# Patient Record
Sex: Male | Born: 1995 | Race: White | Hispanic: Yes | Marital: Single | Smoking: Never smoker
Health system: Southern US, Community
[De-identification: ages and names within clinical notes are randomized; demographics above are authoritative.]

---
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
## 2016-06-25 ENCOUNTER — Emergency Department (HOSPITAL_COMMUNITY)
Admission: EM | Admit: 2016-06-25 | Discharge: 2016-06-25 | Disposition: A | Discharge status: Home / Self Care | Payer: No Typology Code available for payment source | Attending: Emergency Medicine | Admitting: Emergency Medicine

## 2016-06-25 ENCOUNTER — Encounter (HOSPITAL_COMMUNITY): Payer: Self-pay | Admitting: *Deleted

## 2016-06-25 DIAGNOSIS — M542 Cervicalgia: Secondary | ICD-10-CM | POA: Diagnosis not present

## 2016-06-25 DIAGNOSIS — Y92410 Unspecified street and highway as the place of occurrence of the external cause: Secondary | ICD-10-CM | POA: Insufficient documentation

## 2016-06-25 DIAGNOSIS — M545 Low back pain, unspecified: Secondary | ICD-10-CM

## 2016-06-25 DIAGNOSIS — Y939 Activity, unspecified: Secondary | ICD-10-CM | POA: Diagnosis not present

## 2016-06-25 DIAGNOSIS — Z79899 Other long term (current) drug therapy: Secondary | ICD-10-CM | POA: Insufficient documentation

## 2016-06-25 DIAGNOSIS — Y999 Unspecified external cause status: Secondary | ICD-10-CM | POA: Diagnosis not present

## 2016-06-25 MED ORDER — NAPROXEN 500 MG PO TABS: 500.0000 mg | ORAL_TABLET | Freq: Two times a day (BID) | ORAL | 0 refills | Status: DC

## 2016-06-25 MED ORDER — METHOCARBAMOL 500 MG PO TABS: 500.0000 mg | ORAL_TABLET | Freq: Every evening | ORAL | 0 refills | Status: DC | PRN

## 2016-06-25 NOTE — ED Notes (Signed)
Discharge instructions, follow up care, and rx x2 reviewed with patient. Patient verbalized understanding. 
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
## 2019-05-01 ENCOUNTER — Ambulatory Visit: Payer: Self-pay | Attending: Internal Medicine

## 2019-05-01 DIAGNOSIS — Z20822 Contact with and (suspected) exposure to covid-19: Secondary | ICD-10-CM | POA: Insufficient documentation

## 2019-05-02 LAB — NOVEL CORONAVIRUS, NAA: SARS-CoV-2, NAA: NOT DETECTED

## 2022-01-31 ENCOUNTER — Emergency Department (HOSPITAL_COMMUNITY): Payer: Self-pay

## 2022-01-31 ENCOUNTER — Other Ambulatory Visit: Payer: Self-pay

## 2022-01-31 ENCOUNTER — Encounter (HOSPITAL_COMMUNITY): Payer: Self-pay | Admitting: Emergency Medicine

## 2022-01-31 ENCOUNTER — Emergency Department (HOSPITAL_COMMUNITY)
Admission: EM | Admit: 2022-01-31 | Discharge: 2022-01-31 | Discharge status: Against Medical Advice | Payer: Self-pay | Attending: Student | Admitting: Student

## 2022-01-31 DIAGNOSIS — Z5321 Procedure and treatment not carried out due to patient leaving prior to being seen by health care provider: Secondary | ICD-10-CM | POA: Insufficient documentation

## 2022-01-31 DIAGNOSIS — W0110XA Fall on same level from slipping, tripping and stumbling with subsequent striking against unspecified object, initial encounter: Secondary | ICD-10-CM | POA: Insufficient documentation

## 2022-01-31 DIAGNOSIS — Y9302 Activity, running: Secondary | ICD-10-CM | POA: Insufficient documentation

## 2022-01-31 DIAGNOSIS — Y92480 Sidewalk as the place of occurrence of the external cause: Secondary | ICD-10-CM | POA: Insufficient documentation

## 2022-01-31 DIAGNOSIS — S0181XA Laceration without foreign body of other part of head, initial encounter: Secondary | ICD-10-CM | POA: Insufficient documentation

## 2022-01-31 DIAGNOSIS — S4991XA Unspecified injury of right shoulder and upper arm, initial encounter: Secondary | ICD-10-CM | POA: Insufficient documentation

## 2022-01-31 DIAGNOSIS — S01511A Laceration without foreign body of lip, initial encounter: Secondary | ICD-10-CM | POA: Insufficient documentation

## 2022-01-31 MED ORDER — TETANUS-DIPHTH-ACELL PERTUSSIS 5-2.5-18.5 LF-MCG/0.5 IM SUSY: 0.5000 mL | PREFILLED_SYRINGE | Freq: Once | INTRAMUSCULAR | Status: DC

## 2022-01-31 NOTE — ED Notes (Signed)
Pt state they was leaving
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
## 2022-02-01 ENCOUNTER — Other Ambulatory Visit: Payer: Self-pay

## 2022-02-01 ENCOUNTER — Encounter (HOSPITAL_COMMUNITY): Payer: Self-pay

## 2022-02-01 ENCOUNTER — Emergency Department (HOSPITAL_COMMUNITY)
Admission: EM | Admit: 2022-02-01 | Discharge: 2022-02-01 | Disposition: A | Discharge status: Home / Self Care | Payer: Self-pay | Attending: Emergency Medicine | Admitting: Emergency Medicine

## 2022-02-01 DIAGNOSIS — S01511A Laceration without foreign body of lip, initial encounter: Secondary | ICD-10-CM | POA: Insufficient documentation

## 2022-02-01 DIAGNOSIS — W228XXA Striking against or struck by other objects, initial encounter: Secondary | ICD-10-CM | POA: Insufficient documentation

## 2022-02-01 DIAGNOSIS — Z23 Encounter for immunization: Secondary | ICD-10-CM | POA: Insufficient documentation

## 2022-02-01 MED ORDER — TETANUS-DIPHTH-ACELL PERTUSSIS 5-2.5-18.5 LF-MCG/0.5 IM SUSY: 0.5000 mL | PREFILLED_SYRINGE | Freq: Once | INTRAMUSCULAR | Status: DC

## 2022-02-01 MED ORDER — LIDOCAINE HCL 2 % IJ SOLN
10.0000 mL | Freq: Once | INTRAMUSCULAR | Status: AC
  Administered 2022-02-01: 200 mg
  Filled 2022-02-01: qty 20

## 2022-02-01 MED ORDER — AMOXICILLIN-POT CLAVULANATE 875-125 MG PO TABS: 1.0000 | ORAL_TABLET | Freq: Two times a day (BID) | ORAL | 0 refills | Status: DC

## 2022-02-01 MED ORDER — TETANUS-DIPHTH-ACELL PERTUSSIS 5-2.5-18.5 LF-MCG/0.5 IM SUSY
0.5000 mL | PREFILLED_SYRINGE | Freq: Once | INTRAMUSCULAR | Status: AC
  Administered 2022-02-01: 0.5 mL via INTRAMUSCULAR
  Filled 2022-02-01: qty 0.5

## 2022-02-01 MED ORDER — AMOXICILLIN-POT CLAVULANATE 875-125 MG PO TABS
1.0000 | ORAL_TABLET | Freq: Once | ORAL | Status: AC
  Administered 2022-02-01: 1 via ORAL
  Filled 2022-02-01: qty 1

## 2022-02-01 NOTE — Discharge Instructions (Addendum)
Take Augmentin twice daily for a week to prevent infection  As we discussed, I was able to suture your laceration and I recommend soft diet for several days.  I also recommend that you have a wound check visit with urgent care within the next week or so.  We have updated your tetanus shot  See your primary care doctor  Return to ER if you have worse lip swelling, purulent discharge from the lip, severe pain, fever
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
## 2022-02-01 NOTE — ED Provider Notes (Signed)
[facility] EMERGENCY DEPARTMENT Provider Note   CSN: [id number] Arrival date & time: 02/01/22  0865     History  No chief complaint on file.   Michael Oconnor is a 26 y.o. male here presenting with left lower lip laceration.  Patient states that he was drinking Saturday night and tumbled and hit the lower part of his lip and had a laceration.  He states that he waited many hours and decided to leave.  He states that his lip laceration still does not close and he is here for laceration repair.  Patient denies any medical problems.  Denies any other injuries.  The history is provided by the patient.       Home Medications Prior to Admission medications   Medication Sig Start Date End Date Taking? Authorizing Provider  acetaminophen (TYLENOL) 500 MG tablet Take 1,000 mg by mouth once.    [provider]  methocarbamol (ROBAXIN) 500 MG tablet Take 1 tablet (500 mg total) by mouth at bedtime as needed for muscle spasms. 06/25/16   Mathews Robinsons B, PA-C  naproxen (NAPROSYN) 500 MG tablet Take 1 tablet (500 mg total) by mouth 2 (two) times daily with a meal. 06/25/16   Georgiana Shore, PA-C      Allergies    Patient has no known allergies.    Review of Systems   Review of Systems  Skin:  Positive for wound.  All other systems reviewed and are negative.   Physical Exam Updated Vital Signs BP (!) 140/78 (BP Location: Right Arm)   Pulse 81   Temp 98.3 F (36.8 C) (Oral)   Resp 15   SpO2 100%  Physical Exam Vitals and nursing note reviewed.  HENT:     Head: Normocephalic.     Comments: U-shaped laceration in the forehead that is healing     Nose: Nose normal.     Mouth/Throat:     Comments: 4 cm laceration in the left lower lip.  It does cross the vermilion border  Eyes:     Extraocular Movements: Extraocular movements intact.     Pupils: Pupils are equal, round, and reactive to light.  Cardiovascular:     Rate and Rhythm: Normal  rate and regular rhythm.     Pulses: Normal pulses.  Pulmonary:     Effort: Pulmonary effort is normal.     Breath sounds: Normal breath sounds.  Abdominal:     General: Abdomen is flat.     Palpations: Abdomen is soft.  Musculoskeletal:        General: Normal range of motion.     Cervical back: Normal range of motion and neck supple.  Skin:    General: Skin is warm.     Capillary Refill: Capillary refill takes less than 2 seconds.  Neurological:     General: No focal deficit present.     Mental Status: He is alert and oriented to person, place, and time.  Psychiatric:        Mood and Affect: Mood normal.        Behavior: Behavior normal.     ED Results / Procedures / Treatments   Labs (all labs ordered are listed, but only abnormal results are displayed) Labs Reviewed - No data to display  EKG None  Radiology DG Shoulder Right  Result Date: 01/31/2022 CLINICAL DATA:  Fall EXAM: RIGHT SHOULDER - 2+ VIEW COMPARISON:  None Available. FINDINGS: There is no evidence of fracture or dislocation. There  is no evidence of arthropathy or other focal bone abnormality. Soft tissues are unremarkable. IMPRESSION: Negative. Electronically Signed   By: Charlett Nose M.D.   On: 01/31/2022 02:51   CT Head Wo Contrast  Result Date: 01/31/2022 CLINICAL DATA:  Trauma, fall EXAM: CT HEAD WITHOUT CONTRAST CT MAXILLOFACIAL WITHOUT CONTRAST CT CERVICAL SPINE WITHOUT CONTRAST TECHNIQUE: Multidetector CT imaging of the head, cervical spine, and maxillofacial structures were performed using the standard protocol without intravenous contrast. Multiplanar CT image reconstructions of the cervical spine and maxillofacial structures were also generated. RADIATION DOSE REDUCTION: This exam was performed according to the departmental dose-optimization program which includes automated exposure control, adjustment of the mA and/or kV according to patient size and/or use of iterative reconstruction technique.  COMPARISON:  None Available. FINDINGS: CT HEAD FINDINGS Brain: No evidence of acute infarct, hemorrhage, mass, mass effect, or midline shift. No hydrocephalus or extra-axial fluid collection. Vascular: No hyperdense vessel. Skull: Normal. Negative for fracture or focal lesion. Other: The mastoid air cells are well aerated. CT MAXILLOFACIAL FINDINGS Osseous: No fracture or mandibular dislocation. No destructive process. Orbits: Negative. No traumatic or inflammatory finding. Sinuses: Clear. Soft tissues: Laceration to the left lower lip and left chin soft tissues. CT CERVICAL SPINE FINDINGS Alignment: No listhesis. Skull base and vertebrae: No acute fracture. No primary bone lesion or focal pathologic process. Soft tissues and spinal canal: No prevertebral fluid or swelling. No visible canal hematoma. Disc levels:  Disc heights are preserved.  No spinal canal stenosis. Upper chest: Negative. Other: None. IMPRESSION: 1. No acute intracranial process. 2. No acute facial bone fracture. Laceration to the left lower lip and left chin soft tissues. 3. No acute fracture or traumatic listhesis of the cervical spine. Electronically Signed   By: Wiliam Ke M.D.   On: 01/31/2022 02:49   CT Cervical Spine Wo Contrast  Result Date: 01/31/2022 CLINICAL DATA:  Trauma, fall EXAM: CT HEAD WITHOUT CONTRAST CT MAXILLOFACIAL WITHOUT CONTRAST CT CERVICAL SPINE WITHOUT CONTRAST TECHNIQUE: Multidetector CT imaging of the head, cervical spine, and maxillofacial structures were performed using the standard protocol without intravenous contrast. Multiplanar CT image reconstructions of the cervical spine and maxillofacial structures were also generated. RADIATION DOSE REDUCTION: This exam was performed according to the departmental dose-optimization program which includes automated exposure control, adjustment of the mA and/or kV according to patient size and/or use of iterative reconstruction technique. COMPARISON:  None Available.  FINDINGS: CT HEAD FINDINGS Brain: No evidence of acute infarct, hemorrhage, mass, mass effect, or midline shift. No hydrocephalus or extra-axial fluid collection. Vascular: No hyperdense vessel. Skull: Normal. Negative for fracture or focal lesion. Other: The mastoid air cells are well aerated. CT MAXILLOFACIAL FINDINGS Osseous: No fracture or mandibular dislocation. No destructive process. Orbits: Negative. No traumatic or inflammatory finding. Sinuses: Clear. Soft tissues: Laceration to the left lower lip and left chin soft tissues. CT CERVICAL SPINE FINDINGS Alignment: No listhesis. Skull base and vertebrae: No acute fracture. No primary bone lesion or focal pathologic process. Soft tissues and spinal canal: No prevertebral fluid or swelling. No visible canal hematoma. Disc levels:  Disc heights are preserved.  No spinal canal stenosis. Upper chest: Negative. Other: None. IMPRESSION: 1. No acute intracranial process. 2. No acute facial bone fracture. Laceration to the left lower lip and left chin soft tissues. 3. No acute fracture or traumatic listhesis of the cervical spine. Electronically Signed   By: Wiliam Ke M.D.   On: 01/31/2022 02:49   CT Maxillofacial WO CM  Result Date: 01/31/2022 CLINICAL DATA:  Trauma, fall EXAM: CT HEAD WITHOUT CONTRAST CT MAXILLOFACIAL WITHOUT CONTRAST CT CERVICAL SPINE WITHOUT CONTRAST TECHNIQUE: Multidetector CT imaging of the head, cervical spine, and maxillofacial structures were performed using the standard protocol without intravenous contrast. Multiplanar CT image reconstructions of the cervical spine and maxillofacial structures were also generated. RADIATION DOSE REDUCTION: This exam was performed according to the departmental dose-optimization program which includes automated exposure control, adjustment of the mA and/or kV according to patient size and/or use of iterative reconstruction technique. COMPARISON:  None Available. FINDINGS: CT HEAD FINDINGS Brain: No  evidence of acute infarct, hemorrhage, mass, mass effect, or midline shift. No hydrocephalus or extra-axial fluid collection. Vascular: No hyperdense vessel. Skull: Normal. Negative for fracture or focal lesion. Other: The mastoid air cells are well aerated. CT MAXILLOFACIAL FINDINGS Osseous: No fracture or mandibular dislocation. No destructive process. Orbits: Negative. No traumatic or inflammatory finding. Sinuses: Clear. Soft tissues: Laceration to the left lower lip and left chin soft tissues. CT CERVICAL SPINE FINDINGS Alignment: No listhesis. Skull base and vertebrae: No acute fracture. No primary bone lesion or focal pathologic process. Soft tissues and spinal canal: No prevertebral fluid or swelling. No visible canal hematoma. Disc levels:  Disc heights are preserved.  No spinal canal stenosis. Upper chest: Negative. Other: None. IMPRESSION: 1. No acute intracranial process. 2. No acute facial bone fracture. Laceration to the left lower lip and left chin soft tissues. 3. No acute fracture or traumatic listhesis of the cervical spine. Electronically Signed   By: Merilyn Baba M.D.   On: 01/31/2022 02:49    Procedures Procedures    LACERATION REPAIR Performed by: Wandra Arthurs Authorized by: Wandra Arthurs Consent: Verbal consent obtained. Risks and benefits: risks, benefits and alternatives were discussed Consent given by: patient Patient identity confirmed: provided demographic data Prepped and Draped in normal sterile fashion Wound explored  Laceration Location: L lower lip  Laceration Length: 4 cm  No Foreign Bodies seen or palpated  Anesthesia: local infiltration  Local anesthetic: lidocaine 2 % no epinephrine  Anesthetic total: 10 ml  Irrigation method: syringe Amount of cleaning: standard  Skin closure: 4-0 vicryl rapide  Number of sutures: 5  Technique: simple interrupted   Patient tolerance: Patient tolerated the procedure well with no immediate  complications.   Medications Ordered in ED Medications  amoxicillin-clavulanate (AUGMENTIN) 875-125 MG per tablet 1 tablet (has no administration in time range)  lidocaine (XYLOCAINE) 2 % (with pres) injection 200 mg (has no administration in time range)    ED Course/ Medical Decision Making/ A&P                           Medical Decision Making Dontai Pember is a 26 y.o. male here presenting with left lower lip laceration.  Laceration has been there for about 48 hours now.  I discussed with him about laceration repair with better cosmetic result but risking infection versus no repair and poor cosmetic result.  He states that he would rather just have a better cosmetic result and is agreeable to get antibiotics.  5:03 PM Laceration is sutured.  CT scan from yesterday showed no fractures.  Tdap is updated and patient will be discharged home with Augmentin.  Recommend wound check in a week.    Problems Addressed: Lip laceration, initial encounter: acute illness or injury  Risk Prescription drug management.    Final Clinical Impression(s) / ED Diagnoses Final  diagnoses:  None    Rx / DC Orders ED Discharge Orders     None         Charlynne Pander, MD 02/01/22 1704

## 2023-08-05 ENCOUNTER — Other Ambulatory Visit: Payer: Self-pay

## 2023-08-05 ENCOUNTER — Ambulatory Visit
Admission: RE | Admit: 2023-08-05 | Discharge: 2023-08-05 | Disposition: A | Discharge status: Home / Self Care | Payer: Self-pay | Source: Ambulatory Visit | Attending: Physician Assistant

## 2023-08-05 ENCOUNTER — Ambulatory Visit (INDEPENDENT_AMBULATORY_CARE_PROVIDER_SITE_OTHER): Payer: Self-pay | Admitting: Radiology

## 2023-08-05 VITALS — BP 109/63 | HR 74 | Temp 98.3°F | Resp 20 | Ht 66.0 in | Wt 195.0 lb

## 2023-08-05 DIAGNOSIS — R058 Other specified cough: Secondary | ICD-10-CM

## 2023-08-05 DIAGNOSIS — R0989 Other specified symptoms and signs involving the circulatory and respiratory systems: Secondary | ICD-10-CM

## 2023-08-05 DIAGNOSIS — J069 Acute upper respiratory infection, unspecified: Secondary | ICD-10-CM

## 2023-08-05 LAB — POC COVID19/FLU A&B COMBO
Covid Antigen, POC: NEGATIVE
Influenza A Antigen, POC: NEGATIVE
Influenza B Antigen, POC: NEGATIVE

## 2023-08-05 LAB — POCT RAPID STREP A (OFFICE): Rapid Strep A Screen: NEGATIVE

## 2023-08-05 NOTE — ED Triage Notes (Signed)
 Pt presents with complaints of cough, chest tightness, SOB, and sore throat x 2 days. Denies fevers at home. Pt currently rates his overall pain a 5/10, states his chest and throat are causing him the most pain. OTC mucus relief taken with no improvement. Pt does state he has been coughing up green mucus. Exposed to pneumonia by friend on Sunday 4/27.

## 2023-08-05 NOTE — Discharge Instructions (Addendum)
 You are seen today for concerns of productive cough, chest tightness, nasal congestion and runny nose.  Your rapid flu, COVID, strep test were negative.  Your chest x-ray did not show signs of an acute pulmonary issue such as pneumonia or consolidation.  Based on your described symptoms and the duration of symptoms it is likely that you have a viral upper respiratory infection (often called a "cold")  Symptoms can last for 3-10 days with lingering cough and intermittent symptoms potentially  lasting several  weeks after that.  The goal of treatment at this time is to reduce your symptoms and discomfort   You can use the following medications and measures to help yourself feel better until your body fights this off: DayQuil/NyQuil, TheraFlu, Alka-Seltzer  (these medications typically have the same active ingredients in them so you can choose whichever one you prefer and take consistently during the day and night according to the manufactures instructions.) Flonase A daily antihistamine such as Zyrtec, Claritin, Allegra per your preference.  Please choose 1 and take consistently. Increased fluids.  It is recommended that you take in at least 64 ounces of water per day when you are not sick so it is important to increase this when you are sick and your body may be running fever. Rest Cough drops Chloraseptic throat spray to help with sore throat Nasal saline spray or nasal flushes to help with congestion and runny nose  If your symptoms seem like they are getting worse over the next 5 to 7 days or not improving you can always follow-up here in urgent care or go to your primary care provider for further management. Go to the ER if you begin to have more serious symptoms such as shortness of breath, trouble breathing, loss of consciousness, swelling around the eyes, high fever, severe lasting headaches, vision changes or neck pain/stiffness.

## 2023-08-05 NOTE — ED Provider Notes (Signed)
 [facility]    CSN: [id number] Arrival date & time: 08/05/23  1812      History   Chief Complaint Chief Complaint  Patient presents with   Cough    For the past couple of days, I have had chest pains and coughing, along with sore throat. I've been coughing up large amount of dark green mucus for the past two days might have an infection in my lungs - Entered by patient    HPI Michael Oconnor is a 28 y.o. male.   HPI  He reports having coughing, sore throat, SOB and chest tightness for the past 2 days  He denies fevers, chills,  He reports coughing up mucus - dark green and yellow   He reports his friend was sick with pneumonia on Monday. He was around her all day Sunday   Interventions: he has been taking mucus relief medication     History reviewed. No pertinent past medical history.  There are no active problems to display for this patient.   History reviewed. No pertinent surgical history.     Home Medications    Prior to Admission medications   Not on File    Family History History reviewed. No pertinent family history.  Social History Social History   Tobacco Use   Smoking status: Some Days    Types: Cigarettes   Smokeless tobacco: Never  Vaping Use   Vaping status: Never Used  Substance Use Topics   Alcohol use: Not Currently   Drug use: Yes    Comment: CBD     Allergies   Patient has no known allergies.   Review of Systems Review of Systems  Constitutional:  Negative for chills and fever.  HENT:  Positive for congestion, postnasal drip, rhinorrhea and sore throat. Negative for ear pain.   Respiratory:  Positive for cough, chest tightness, shortness of breath and wheezing.   Gastrointestinal:  Negative for diarrhea, nausea and vomiting.  Musculoskeletal:  Negative for myalgias.  Skin:  Negative for rash.     Physical Exam Triage Vital Signs ED Triage Vitals  Encounter Vitals Group     BP 08/05/23 1820 109/63      Systolic BP Percentile --      Diastolic BP Percentile --      Pulse Rate 08/05/23 1820 74     Resp 08/05/23 1820 20     Temp 08/05/23 1820 98.3 F (36.8 C)     Temp Source 08/05/23 1820 Oral     SpO2 08/05/23 1820 95 %     Weight 08/05/23 1820 195 lb (88.5 kg)     Height 08/05/23 1820 5\' 6"  (1.676 m)     Head Circumference --      Peak Flow --      Pain Score 08/05/23 1829 5     Pain Loc --      Pain Education --      Exclude from Growth Chart --    No data found.  Updated Vital Signs BP 109/63 (BP Location: Right Arm)   Pulse 74   Temp 98.3 F (36.8 C) (Oral)   Resp 20   Ht 5\' 6"  (1.676 m)   Wt 195 lb (88.5 kg)   SpO2 95%   BMI 31.47 kg/m   Visual Acuity Right Eye Distance:   Left Eye Distance:   Bilateral Distance:    Right Eye Near:   Left Eye Near:    Bilateral Near:  Physical Exam Vitals reviewed.  Constitutional:      General: He is awake. He is not in acute distress.    Appearance: Normal appearance. He is well-developed and well-groomed. He is not ill-appearing or toxic-appearing.     Comments: Patient is a pleasant 28 year old male who appears stated age.  He is seated comfortably in exam room and does not appear to be in acute distress at time of evaluation.  HENT:     Head: Normocephalic and atraumatic.     Right Ear: Hearing, tympanic membrane and ear canal normal.     Left Ear: Hearing, tympanic membrane and ear canal normal.     Mouth/Throat:     Lips: Pink.     Mouth: Mucous membranes are moist.     Pharynx: Oropharynx is clear. Uvula midline. No pharyngeal swelling, oropharyngeal exudate, posterior oropharyngeal erythema, uvula swelling or postnasal drip.     Tonsils: No tonsillar exudate or tonsillar abscesses.  Eyes:     General: Lids are normal. Gaze aligned appropriately.     Extraocular Movements: Extraocular movements intact.  Cardiovascular:     Rate and Rhythm: Normal rate and regular rhythm.     Heart sounds: Normal heart  sounds.  Pulmonary:     Effort: Pulmonary effort is normal.     Breath sounds: Normal breath sounds. Decreased air movement present. No decreased breath sounds, wheezing, rhonchi or rales.  Musculoskeletal:     Cervical back: Normal range of motion and neck supple.  Lymphadenopathy:     Head:     Right side of head: No submental, submandibular or preauricular adenopathy.     Left side of head: No submental, submandibular or preauricular adenopathy.     Cervical:     Right cervical: No superficial cervical adenopathy.    Left cervical: No superficial cervical adenopathy.     Upper Body:     Right upper body: No supraclavicular adenopathy.     Left upper body: No supraclavicular adenopathy.  Skin:    General: Skin is warm and dry.  Neurological:     General: No focal deficit present.     Mental Status: He is alert and oriented to person, place, and time.  Psychiatric:        Mood and Affect: Mood normal.        Behavior: Behavior normal. Behavior is cooperative.        Thought Content: Thought content normal.        Judgment: Judgment normal.      UC Treatments / Results  Labs (all labs ordered are listed, but only abnormal results are displayed) Labs Reviewed  POC COVID19/FLU A&B COMBO  POCT RAPID STREP A (OFFICE)    EKG   Radiology DG Chest 2 View Result Date: 08/05/2023 CLINICAL DATA:  Decreased air movement with productive cough, chest tightness, shortness of breath, and sore throat for 2 days. EXAM: CHEST - 2 VIEW COMPARISON:  11/04/2011 FINDINGS: The heart size and mediastinal contours are within normal limits. Both lungs are clear. The visualized skeletal structures are unremarkable. IMPRESSION: No active cardiopulmonary disease. Electronically Signed   By: Boyce Byes M.D.   On: 08/05/2023 19:14    Procedures Procedures (including critical care time)  Medications Ordered in UC Medications - No data to display  Initial Impression / Assessment and Plan / UC  Course  I have reviewed the triage vital signs and the nursing notes.  Pertinent labs & imaging results that were available during my  care of the patient were reviewed by me and considered in my medical decision making (see chart for details).      Final Clinical Impressions(s) / UC Diagnoses   Final diagnoses:  Productive cough  Decreased breath sounds of both lungs  Viral upper respiratory tract infection   Patient presents today with concerns for productive cough, chest tightness and sore throat for the past 2 to 3 days.  He reports he recently spent the day with a friend who was diagnosed with pneumonia and is concerned for potential exposure.  He has been taking over-the-counter mucus relief medication but reports minimal improvement with symptoms.  He denies fever, chills, nausea, vomiting, body aches.  Physical exam is notable for mildly decreased air movement globally.  This coupled with mild tachypnea and respiratory rate of 28 along with oxygen saturation at 95% I am potentially concerned for pulmonary issue.  Chest x-ray completed which did not show signs of acute cardiopulmonary disease.  Rapid flu, COVID, strep test were all negative.  I reviewed these results with patient during his appointment.  At this time suspect viral upper respiratory tract illness.  Recommend over-the-counter medications for symptomatic relief.  ED and return precautions reviewed and provided in after visit summary.  Follow-up as needed.    Discharge Instructions      You are seen today for concerns of productive cough, chest tightness, nasal congestion and runny nose.  Your rapid flu, COVID, strep test were negative.  Your chest x-ray did not show signs of an acute pulmonary issue such as pneumonia or consolidation.  Based on your described symptoms and the duration of symptoms it is likely that you have a viral upper respiratory infection (often called a "cold")  Symptoms can last for 3-10 days with  lingering cough and intermittent symptoms potentially  lasting several  weeks after that.  The goal of treatment at this time is to reduce your symptoms and discomfort   You can use the following medications and measures to help yourself feel better until your body fights this off: DayQuil/NyQuil, TheraFlu, Alka-Seltzer  (these medications typically have the same active ingredients in them so you can choose whichever one you prefer and take consistently during the day and night according to the manufactures instructions.) Flonase A daily antihistamine such as Zyrtec, Claritin, Allegra per your preference.  Please choose 1 and take consistently. Increased fluids.  It is recommended that you take in at least 64 ounces of water per day when you are not sick so it is important to increase this when you are sick and your body may be running fever. Rest Cough drops Chloraseptic throat spray to help with sore throat Nasal saline spray or nasal flushes to help with congestion and runny nose  If your symptoms seem like they are getting worse over the next 5 to 7 days or not improving you can always follow-up here in urgent care or go to your primary care provider for further management. Go to the ER if you begin to have more serious symptoms such as shortness of breath, trouble breathing, loss of consciousness, swelling around the eyes, high fever, severe lasting headaches, vision changes or neck pain/stiffness.       ED Prescriptions   None    PDMP not reviewed this encounter.   Jerona Mooring, PA-C 08/05/23 7829
